# Patient Record
Sex: Female | Born: 1985 | Race: White | Hispanic: No | Marital: Married | Smoking: Never smoker
Health system: Southern US, Community
[De-identification: ages and names within clinical notes are randomized; demographics above are authoritative.]

## PROBLEM LIST (undated history)

## (undated) DIAGNOSIS — J45909 Unspecified asthma, uncomplicated: Secondary | ICD-10-CM

## (undated) HISTORY — PX: APPENDECTOMY: SHX54

---

## 2014-04-19 ENCOUNTER — Emergency Department (HOSPITAL_BASED_OUTPATIENT_CLINIC_OR_DEPARTMENT_OTHER)
Admission: EM | Admit: 2014-04-19 | Discharge: 2014-04-19 | Disposition: A | Discharge status: Home / Self Care | Payer: BC Managed Care – PPO | Attending: Emergency Medicine | Admitting: Emergency Medicine

## 2014-04-19 ENCOUNTER — Encounter (HOSPITAL_BASED_OUTPATIENT_CLINIC_OR_DEPARTMENT_OTHER): Payer: Self-pay | Admitting: *Deleted

## 2014-04-19 DIAGNOSIS — J029 Acute pharyngitis, unspecified: Secondary | ICD-10-CM | POA: Insufficient documentation

## 2014-04-19 LAB — RAPID STREP SCREEN (MED CTR MEBANE ONLY): STREPTOCOCCUS, GROUP A SCREEN (DIRECT): NEGATIVE

## 2014-04-19 MED ORDER — GUAIFENESIN-CODEINE 100-10 MG/5ML PO SYRP: 5.0000 mL | ORAL_SOLUTION | Freq: Three times a day (TID) | ORAL | Status: DC | PRN

## 2014-04-19 NOTE — ED Notes (Signed)
Patient has had a sore throat since appx Monday, denies any other symptoms, but states it is difficult to swallow

## 2014-04-19 NOTE — ED Provider Notes (Signed)
CSN: [id number]     Arrival date & time 04/19/14  1505 History   First MD Initiated Contact with Patient 04/19/14 1621     Chief Complaint  Patient presents with  . Sore Throat     (Consider location/radiation/quality/duration/timing/severity/associated sxs/prior Treatment) HPI    28 year old female who presents for evaluation of sore throat. Per mom, for the past week she has had throat discomfort. She described as a soreness sensation worsening with swallowing both liquid and solid. Symptom is getting progressively worse however patient denies having any fever, chills, runny nose, sneezing, coughing, ear pain, neck pain, chest pain, shortness of breath, productive cough, abdominal pain, nausea vomiting and diarrhea. She is a nonsmoker. She did tries taking Tylenol with minimal relief. [place] from [place] family for [place]. She mentioned her daughter also has been complaining of sore throat as well. She does have history of strep throat in the past.  History reviewed. No pertinent past medical history. Past Surgical History  Procedure Laterality Date  . Appendectomy     No family history on file. History  Substance Use Topics  . Smoking status: Never Smoker   . Smokeless tobacco: Not on file  . Alcohol Use: No   OB History    No data available     Review of Systems  All other systems reviewed and are negative.     Allergies  Review of patient's allergies indicates no known allergies.  Home Medications   Prior to Admission medications   Not on File   BP 155/106 mmHg  Pulse 110  Temp(Src) 98.4 F (36.9 C) (Oral)  Resp 18  SpO2 100%  LMP 03/29/2014 Physical Exam  Constitutional: She appears well-developed and well-nourished. No distress.  HENT:  Head: Atraumatic.  Right Ear: External ear normal.  Left Ear: External ear normal.  Nose: Nose normal.  Mouth/Throat: Oropharynx is clear and moist. No oropharyngeal exudate.   Throat: Uvula is midline, no tonsillar enlargement or exudates, no evidence of deep tissue infection, no trismus.  Eyes: Conjunctivae are normal.  Neck: Normal range of motion. Neck supple.  Cardiovascular: Normal rate and regular rhythm.   Pulmonary/Chest: Effort normal and breath sounds normal.  Abdominal: Soft. There is no tenderness.  No splenomegaly, abdomen nontender to exam  Lymphadenopathy:    She has no cervical adenopathy.  Neurological: She is alert.  Skin: No rash noted.  Psychiatric: She has a normal mood and affect.  Nursing note and vitals reviewed.   ED Course  Procedures (including critical care time)  4:47 PM Patient here complaining of sore throat. She has no change in her voice, no trouble swallowing at this time, and an unremarkable oral exam. Rapid strep screen obtained  6:14 PM Strep negative.  Pt tolerates PO.  Stable for discharge with sxs treatment.    Labs Review Labs Reviewed  RAPID STREP SCREEN    Imaging Review No results found.   EKG Interpretation None      MDM   Final diagnoses:  Viral pharyngitis    BP 155/106 mmHg  Pulse 110  Temp(Src) 98.4 F (36.9 C) (Oral)  Resp 18  SpO2 100%  LMP 03/29/2014     Fayrene HelperBowie Myrtie Leuthold, PA-C 04/19/14 1814  Toy CookeyMegan Docherty, MD 04/20/14 0003

## 2014-04-19 NOTE — Discharge Instructions (Signed)

## 2014-04-19 NOTE — ED Notes (Signed)
rx x 1 given for Robitussin AC- d/c home with family

## 2014-04-21 LAB — CULTURE, GROUP A STREP

## 2014-04-23 ENCOUNTER — Encounter (HOSPITAL_BASED_OUTPATIENT_CLINIC_OR_DEPARTMENT_OTHER): Payer: Self-pay | Admitting: Emergency Medicine

## 2014-04-23 ENCOUNTER — Emergency Department (HOSPITAL_BASED_OUTPATIENT_CLINIC_OR_DEPARTMENT_OTHER)
Admission: EM | Admit: 2014-04-23 | Discharge: 2014-04-23 | Discharge status: Against Medical Advice | Payer: BC Managed Care – PPO | Attending: Emergency Medicine | Admitting: Emergency Medicine

## 2014-04-23 DIAGNOSIS — J029 Acute pharyngitis, unspecified: Secondary | ICD-10-CM | POA: Insufficient documentation

## 2014-04-23 NOTE — ED Notes (Signed)
Patient states that her sore throat is not getting any better after the treatment that she has received here

## 2019-09-06 ENCOUNTER — Encounter (HOSPITAL_BASED_OUTPATIENT_CLINIC_OR_DEPARTMENT_OTHER): Payer: Self-pay | Admitting: *Deleted

## 2019-09-06 ENCOUNTER — Emergency Department (HOSPITAL_BASED_OUTPATIENT_CLINIC_OR_DEPARTMENT_OTHER): Payer: BC Managed Care – PPO

## 2019-09-06 ENCOUNTER — Emergency Department (HOSPITAL_BASED_OUTPATIENT_CLINIC_OR_DEPARTMENT_OTHER)
Admission: EM | Admit: 2019-09-06 | Discharge: 2019-09-06 | Disposition: A | Discharge status: Home / Self Care | Payer: BC Managed Care – PPO | Attending: Emergency Medicine | Admitting: Emergency Medicine

## 2019-09-06 ENCOUNTER — Other Ambulatory Visit: Payer: Self-pay

## 2019-09-06 DIAGNOSIS — Y999 Unspecified external cause status: Secondary | ICD-10-CM | POA: Diagnosis not present

## 2019-09-06 DIAGNOSIS — J45909 Unspecified asthma, uncomplicated: Secondary | ICD-10-CM | POA: Insufficient documentation

## 2019-09-06 DIAGNOSIS — Y92410 Unspecified street and highway as the place of occurrence of the external cause: Secondary | ICD-10-CM | POA: Diagnosis not present

## 2019-09-06 DIAGNOSIS — S161XXA Strain of muscle, fascia and tendon at neck level, initial encounter: Secondary | ICD-10-CM | POA: Insufficient documentation

## 2019-09-06 DIAGNOSIS — S0990XA Unspecified injury of head, initial encounter: Secondary | ICD-10-CM | POA: Insufficient documentation

## 2019-09-06 DIAGNOSIS — Y9389 Activity, other specified: Secondary | ICD-10-CM | POA: Diagnosis not present

## 2019-09-06 HISTORY — DX: Unspecified asthma, uncomplicated: J45.909

## 2019-09-06 MED ORDER — DICLOFENAC SODIUM 1 % EX GEL: 2.0000 g | Freq: Four times a day (QID) | CUTANEOUS | 0 refills | Status: AC | PRN

## 2019-09-06 MED ORDER — METHOCARBAMOL 500 MG PO TABS: 500.0000 mg | ORAL_TABLET | Freq: Three times a day (TID) | ORAL | 0 refills | Status: AC | PRN

## 2019-09-06 MED ORDER — IBUPROFEN 800 MG PO TABS: 800.0000 mg | ORAL_TABLET | Freq: Three times a day (TID) | ORAL | 0 refills | Status: AC | PRN

## 2019-09-06 MED FILL — IBUPROFEN 800 MG TAB: 800 | 7 days supply | Qty: 21 | Fill #0

## 2019-09-06 MED FILL — VOLTAREN 1 % GEL: 1 | 7 days supply | Qty: 50 | Fill #0

## 2019-09-06 NOTE — ED Notes (Signed)
ED Provider at bedside. 
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
